# Patient Record
Sex: Male | Born: 1985 | Hispanic: Yes | Marital: Single | Smoking: Current every day smoker
Health system: Southern US, Community
[De-identification: ages and names within clinical notes are randomized; demographics above are authoritative.]

---

## 2015-06-28 ENCOUNTER — Encounter: Payer: Self-pay | Admitting: Emergency Medicine

## 2015-06-28 ENCOUNTER — Emergency Department: Payer: Self-pay

## 2015-06-28 ENCOUNTER — Emergency Department
Admission: EM | Admit: 2015-06-28 | Discharge: 2015-06-28 | Disposition: A | Discharge status: Home / Self Care | Payer: Self-pay | Attending: Emergency Medicine | Admitting: Emergency Medicine

## 2015-06-28 DIAGNOSIS — W11XXXA Fall on and from ladder, initial encounter: Secondary | ICD-10-CM | POA: Insufficient documentation

## 2015-06-28 DIAGNOSIS — Y92009 Unspecified place in unspecified non-institutional (private) residence as the place of occurrence of the external cause: Secondary | ICD-10-CM | POA: Insufficient documentation

## 2015-06-28 DIAGNOSIS — F17200 Nicotine dependence, unspecified, uncomplicated: Secondary | ICD-10-CM | POA: Insufficient documentation

## 2015-06-28 DIAGNOSIS — Y9389 Activity, other specified: Secondary | ICD-10-CM | POA: Insufficient documentation

## 2015-06-28 DIAGNOSIS — S52121A Displaced fracture of head of right radius, initial encounter for closed fracture: Secondary | ICD-10-CM | POA: Insufficient documentation

## 2015-06-28 DIAGNOSIS — Y998 Other external cause status: Secondary | ICD-10-CM | POA: Insufficient documentation

## 2015-06-28 MED ORDER — OXYCODONE-ACETAMINOPHEN 5-325 MG PO TABS: 1.0000 | ORAL_TABLET | ORAL | Status: AC | PRN

## 2015-06-28 NOTE — Discharge Instructions (Signed)
Fractura de antebrazo (Forearm Fracture) Julieta BelliniUna fractura de antebrazo es una fractura de uno de los huesos del brazo que se encuentran entre el codo y la [place]ueca. El antebrazo est formado por dos huesos:  El radio. Es el hueso que se encuentra en la parte interna del brazo cerca del pulgar.  El cbito. Es el hueso que se encuentra en la parte externa del brazo cerca del meique. En las fracturas que ocurren en la parte media del antebrazo suelen romperse el radio y el cbito. La mayora de las fracturas de antebrazo que se producen en el cbito y el radio requerirn Azerbaijanciruga. CAUSAS Las causas frecuentes de este tipo de fractura incluyen lo siguiente:  Cada sobre un brazo extendido.  Accidentes, por ejemplo, automovilstico o en bicicleta.  Un golpe fuerte y Engineering geologistdirecto en la parte media del brazo. FACTORES DE RIESGO Puede correr un riesgo mayor de tener este tipo de fractura si:  Practica deportes de contacto.  Tiene una afeccin que hace que los huesos se vuelvan delgados o frgiles (osteoporosis). SIGNOS Y SNTOMAS La fractura de antebrazo causa dolor inmediatamente despus de producirse la lesin. Otros signos y sntomas incluyen los siguientes:  Claudette LawsUna curva o un bulto anormal en el brazo (deformidad).  Hinchazn.  Adormecimiento u hormigueo.  Dolor a Insurance claims handlerla palpacin.  Imposibilidad de girar la mano de un lado al otro (rotar).  Moretones. DIAGNSTICO El mdico puede diagnosticar la fractura de antebrazo en funcin de lo siguiente:  Sus sntomas.  La historia clnica, incluida cualquier lesin reciente.  Un examen fsico. El mdico observar si hay alguna deformidad y determinar si la palpacin de la Industrial/product designerfractura provoca dolor. Adems, revisar si los huesos estn desplazados.  Una radiografa para confirmar el diagnstico y obtener ms informacin respecto del tipo de Surveyor, mineralsfractura. TRATAMIENTO Los PepsiCoobjetivos del tratamiento son Radio producermantener el o los huesos en la posicin correcta e  inmovilizarlos para que se consoliden con el transcurso del Melrosetiempo. El tratamiento depender de muchos factores, especialmente del tipo de Surveyor, mineralsfractura.  Si el o los huesos fracturados:  Estn en la posicin correcta (sin desplazamiento), tal vez solo sea necesario que le coloquen un yeso o una frula.  Estn levemente desplazados, tal vez haya que reacomodarlos manualmente (reduccin cerrada) antes de la colocacin del yeso o la frula.  Antes del yeso, es posible que deban colocarle una frula temporal. La frula deja espacio suficiente por si hay hinchazn. Despus de Time Warneralgunos das, se quita la frula y se coloca el yeso.  Es posible que deba tener el yeso durante 6 a 8semanas o como se lo haya indicado el mdico.  El yeso puede cambiarse despus de transcurridas aproximadamente 3semanas o como se lo haya indicado el mdico.  Una vez retirado el yeso, quizs tenga que hacer fisioterapia para Copyrecuperar la totalidad del movimiento de la [place]ueca o el codo.  Es posible que deba someterse a una ciruga de emergencia si tiene lo siguiente:  Uno o ambos huesos fracturados desplazados.  Una fractura con muchos fragmentos (fractura conminuta).  Una fractura que desgarra la piel (fractura abierta). Este tipo de fractura puede requerir la colocacin de Nurse, children'salambres quirrgicos, placas o tornillos para Radio producermantener el o los Airline pilothuesos en su lugar.  Es posible que, [address] dos sem[address], le hagan radiografas para Stage managercontrolar la consolidacin. INSTRUCCIONES PARA EL CUIDADO EN EL HOGAR Si tiene un yeso:  No introduzca nada adentro del yeso para rascarse la piel. Esto puede aumentar el riesgo de tener infecciones.  Liberty GlobalControle todos los das  la piel de alrededor del yeso. Informe al mdico cualquier inquietud que tenga. Puede aplicar una locin en la piel seca alrededor de los bordes del yeso. No aplique locin en la piel por debajo del yeso. Si tiene una frula:  sela como se lo haya indicado el mdico. Qutesela  solamente como se lo haya indicado el mdico.  Afloje la frula si los dedos se le entumecen, siente hormigueos o se le enfran y se tornan de Research officer, trade unioncolor azul. El bao  Cubra el yeso o la frula con una bolsa de plstico hermtica para protegerlos del agua mientras toma un bao de inmersin o [place] and Herz[place]ovinauna ducha. No permita que el yeso o la frula se mojen. Control del dolor, la rigidez y la hinchazn  Si se lo indican, aplique hielo sobre la zona lesionada:  Ponga el hielo en una bolsa plstica.  Coloque una toalla entre la piel y la bolsa de hielo.  Coloque el hielo durante 20 minutos, 2 a 3 veces por da.  Mueva los dedos con frecuencia para evitar la rigidez y reducir la hinchazn.  Cuando est sentado o acostado, mantenga la zona lesionada por encima del nivel del corazn. Conducir  No conduzca ni opere maquinaria pesada mientras toma analgsicos.  No conduzca mientras Botswanausa un yeso o una frula en la mano. Actividad  Reanude sus actividades normales como se lo haya indicado el mdico. Pregntele al mdico qu actividades son seguras para usted.  Haga ejercicios de flexibilidad solamente como se lo haya indicado el mdico. Seguridad  No apoye el peso del cuerpo sobre la extremidad lesionada hasta tanto el mdico lo autorice. Instrucciones generales  No ejerza presin en ninguna parte del yeso o de la frula hasta que se hayan endurecido. Esto puede tomar Ashlandvarias horas.  Mantenga el yeso o la frula limpios y secos.  No consuma ningn producto que contenga tabaco, lo que incluye cigarrillos, tabaco de Theatre managermascar o Administrator, Civil Servicecigarrillos electrnicos. El tabaco puede retardar la consolidacin de la fractura. Si necesita ayuda para dejar de fumar, consulte al mdico.  Tome los medicamentos solamente como se lo haya indicado el mdico.  Concurra a todas las visitas de control como se lo haya indicado el mdico. Esto es importante. SOLICITE ATENCIN MDICA SI:  El medicamento no IT trainerle calma el dolor.  El  yeso o la frula se mojan o se daan, o repentinamente siente que estn muy ajustados.  El yeso se afloja.  Tiene dolor ms intenso o ms hinchazn que antes de Landque le colocaran el yeso.  Tiene dolor intenso cuando Hewlett-Packardestira los dedos.  Sigue teniendo Engineer, miningdolor o rigidez en el codo o la mueca despus de que le saquen el yeso. SOLICITE ATENCIN MDICA DE INMEDIATO SI:  No puede mover los dedos.  Pierde la sensibilidad en los dedos o la [place]mano.  La mano o los dedos estn fros y se le ponen plidos o de color [place]azul.  Percibe que sale mal olor del yeso.  Observa una secrecin que proviene desde el interior del yeso.  Tiene manchas de sangre o secrecin nuevas que se filtran por el yeso.   Esta informacin no tiene Theme park managercomo fin reemplazar el consejo del mdico. Asegrese de hacerle al mdico cualquier pregunta que tenga.   Document Released: 03/24/2005 Document Revised: 04/14/2014 Elsevier Interactive Patient Education [employer]2016 Elsevier Inc.

## 2015-06-28 NOTE — ED Provider Notes (Signed)
[facility] Emergency Department Provider Note  ____________________________________________  Time seen: Approximately 11:07 AM  I have reviewed the triage vital signs and the nursing notes.   HISTORY  Chief Complaint Fall and Wrist Pain    HPI Ryan Keller is a 30 y.o. male presents for evaluation of right wrist pain. Hand right hand swelling. Patient states he fell off a ladder yesterday complains of pain to his right wrist. Rates his pain is about 6/10. Unable to fully rotate his hand and wrist. History reviewed. No pertinent past medical history.  There are no active problems to display for this patient.   History reviewed. No pertinent past surgical history.  Current Outpatient Rx  Name  Route  Sig  Dispense  Refill  . oxyCODONE-acetaminophen (ROXICET) 5-325 MG tablet   Oral   Take 1-2 tablets by mouth every 4 (four) hours as needed for severe pain.   15 tablet   0     Allergies Review of patient's allergies indicates no known allergies.  History reviewed. No pertinent family history.  Social History Social History  Substance Use Topics  . Smoking status: Current Every Day Smoker  . Smokeless tobacco: None  . Alcohol Use: Yes    Review of Systems Constitutional: No fever/chills Eyes: No visual changes. ENT: No sore throat. Cardiovascular: Denies chest pain. Respiratory: Denies shortness of breath. Musculoskeletal: Positive for right wrist pain Skin: Negative for rash. Neurological: Negative for headaches, focal weakness or numbness.  10-point ROS otherwise negative.  ____________________________________________   PHYSICAL EXAM:  VITAL SIGNS: ED Triage Vitals  Enc Vitals Group     BP 06/28/15 1009 143/93 mmHg     Pulse Rate 06/28/15 1009 66     Resp 06/28/15 1009 20     Temp 06/28/15 1009 98 F (36.7 C)     Temp Source 06/28/15 1009 Oral     SpO2 06/28/15 1009 100 %     Weight 06/28/15 1009 180 lb  (81.647 kg)     Height 06/28/15 1009 5\' 6"  (1.676 m)     Head Cir --      Peak Flow --      Pain Score 06/28/15 1009 6     Pain Loc --      Pain Edu? --      Excl. in GC? --     Constitutional: Alert and oriented. Well appearing and in no acute distress. Musculoskeletal: Right wrist extremely edematous right hand extremely edematous distally neurovascularly intact limited range of motion unable to pronate and supinate right hand. Neurologic:  Normal speech and language. No gross focal neurologic deficits are appreciated. No gait instability. Skin:  Skin is warm, dry and intact. No rash noted. Psychiatric: Mood and affect are normal. Speech and behavior are normal.  ____________________________________________   LABS (all labs ordered are listed, but only abnormal results are displayed)  Labs Reviewed - No data to display ____________________________________________  EKG   ____________________________________________  RADIOLOGY  Positive for a minimally displaced right distal radial fracture. ____________________________________________   PROCEDURES  Procedure(s) performed: None  Critical Care performed: No  ____________________________________________   INITIAL IMPRESSION / ASSESSMENT AND PLAN / ED COURSE  Pertinent labs & imaging results that were available during my care of the patient were reviewed by me and considered in my medical decision making (see chart for details).  Radial fracture. Rx given for Percocet 5/325 patient placed in an OCL splint and referred to Dr. Rosita KeaMenz, orthopedic doctor on  call ____________________________________________   FINAL CLINICAL IMPRESSION(S) / ED DIAGNOSES  Final diagnoses:  Radial head fracture, closed, right, initial encounter     This chart was dictated using voice recognition software/Dragon. Despite best efforts to proofread, errors can occur which can change the meaning. Any change was purely  unintentional.   Evangeline Dakin, PA-C 06/28/15 1118  Emily Filbert, MD 06/28/15 1120

## 2015-06-28 NOTE — ED Notes (Signed)
States he fell yesterday  Pain and swelling noted to right wrist. Positive pulses noted

## 2015-06-28 NOTE — ED Notes (Signed)
Pt to ed with c/o fall off ladder yesterday while at home, pt states pain in right hand and right wrist.  Swelling noted at triage. +movement, pulse and sensation in right wrist.

## 2017-04-29 ENCOUNTER — Emergency Department: Payer: Worker's Compensation

## 2017-04-29 ENCOUNTER — Emergency Department
Admission: EM | Admit: 2017-04-29 | Discharge: 2017-04-29 | Disposition: A | Discharge status: Home / Self Care | Payer: Worker's Compensation | Attending: Emergency Medicine | Admitting: Emergency Medicine

## 2017-04-29 ENCOUNTER — Encounter: Payer: Self-pay | Admitting: Intensive Care

## 2017-04-29 DIAGNOSIS — Y9289 Other specified places as the place of occurrence of the external cause: Secondary | ICD-10-CM | POA: Insufficient documentation

## 2017-04-29 DIAGNOSIS — S52514A Nondisplaced fracture of right radial styloid process, initial encounter for closed fracture: Secondary | ICD-10-CM | POA: Insufficient documentation

## 2017-04-29 DIAGNOSIS — F17210 Nicotine dependence, cigarettes, uncomplicated: Secondary | ICD-10-CM | POA: Insufficient documentation

## 2017-04-29 DIAGNOSIS — S01112A Laceration without foreign body of left eyelid and periocular area, initial encounter: Secondary | ICD-10-CM | POA: Insufficient documentation

## 2017-04-29 DIAGNOSIS — Z23 Encounter for immunization: Secondary | ICD-10-CM | POA: Insufficient documentation

## 2017-04-29 DIAGNOSIS — S0292XA Unspecified fracture of facial bones, initial encounter for closed fracture: Secondary | ICD-10-CM | POA: Insufficient documentation

## 2017-04-29 DIAGNOSIS — Y939 Activity, unspecified: Secondary | ICD-10-CM | POA: Insufficient documentation

## 2017-04-29 DIAGNOSIS — S0990XA Unspecified injury of head, initial encounter: Secondary | ICD-10-CM

## 2017-04-29 DIAGNOSIS — S022XXA Fracture of nasal bones, initial encounter for closed fracture: Secondary | ICD-10-CM

## 2017-04-29 DIAGNOSIS — S52502A Unspecified fracture of the lower end of left radius, initial encounter for closed fracture: Secondary | ICD-10-CM

## 2017-04-29 DIAGNOSIS — W132XXA Fall from, out of or through roof, initial encounter: Secondary | ICD-10-CM | POA: Insufficient documentation

## 2017-04-29 DIAGNOSIS — Y990 Civilian activity done for income or pay: Secondary | ICD-10-CM | POA: Insufficient documentation

## 2017-04-29 LAB — COMPREHENSIVE METABOLIC PANEL
ALBUMIN: 4.4 g/dL (ref 3.5–5.0)
ALK PHOS: 68 U/L (ref 38–126)
ALT: 25 U/L (ref 17–63)
AST: 42 U/L — AB (ref 15–41)
Anion gap: 8 (ref 5–15)
BUN: 15 mg/dL (ref 6–20)
CALCIUM: 9.1 mg/dL (ref 8.9–10.3)
CHLORIDE: 105 mmol/L (ref 101–111)
CO2: 22 mmol/L (ref 22–32)
CREATININE: 1.27 mg/dL — AB (ref 0.61–1.24)
GFR calc Af Amer: >60 mL/min (ref >60)
GFR calc non Af Amer: >60 mL/min (ref >60)
GLUCOSE: 140 mg/dL — AB (ref 65–99)
Potassium: 3.2 mmol/L — ABNORMAL LOW (ref 3.5–5.1)
SODIUM: 135 mmol/L (ref 135–145)
Total Bilirubin: 0.7 mg/dL (ref 0.3–1.2)
Total Protein: 8.1 g/dL (ref 6.5–8.1)

## 2017-04-29 LAB — CBC WITH DIFFERENTIAL/PLATELET
BASOS ABS: 0 10*3/uL (ref 0–0.1)
BASOS PCT: 0 %
EOS ABS: 0.3 10*3/uL (ref 0–0.7)
Eosinophils Relative: 3 %
HCT: 42.8 % (ref 40.0–52.0)
HEMOGLOBIN: 14.4 g/dL (ref 13.0–18.0)
LYMPHS ABS: 3.7 10*3/uL — AB (ref 1.0–3.6)
Lymphocytes Relative: 33 %
MCH: 29.5 pg (ref 26.0–34.0)
MCHC: 33.7 g/dL (ref 32.0–36.0)
MCV: 87.5 fL (ref 80.0–100.0)
Monocytes Absolute: 0.5 10*3/uL (ref 0.2–1.0)
Monocytes Relative: 5 %
NEUTROS PCT: 59 %
Neutro Abs: 6.7 10*3/uL — ABNORMAL HIGH (ref 1.4–6.5)
Platelets: 338 10*3/uL (ref 150–440)
RBC: 4.89 MIL/uL (ref 4.40–5.90)
RDW: 12.6 % (ref 11.5–14.5)
WBC: 11.2 10*3/uL — AB (ref 3.8–10.6)

## 2017-04-29 LAB — ETHANOL: Alcohol, Ethyl (B): <10 mg/dL (ref <10)

## 2017-04-29 MED ORDER — LIDOCAINE-EPINEPHRINE (PF) 2 %-1:200000 IJ SOLN: 10.0000 mL | Freq: Once | INTRAMUSCULAR | Status: DC

## 2017-04-29 MED ORDER — LORAZEPAM 2 MG/ML IJ SOLN
1.0000 mg | Freq: Once | INTRAMUSCULAR | Status: AC
  Administered 2017-04-29: 1 mg via INTRAVENOUS
  Filled 2017-04-29: qty 1

## 2017-04-29 MED ORDER — HYDROMORPHONE HCL 1 MG/ML IJ SOLN
1.0000 mg | Freq: Once | INTRAMUSCULAR | Status: AC
  Administered 2017-04-29: 1 mg via INTRAVENOUS

## 2017-04-29 MED ORDER — TETANUS-DIPHTH-ACELL PERTUSSIS 5-2.5-18.5 LF-MCG/0.5 IM SUSP
0.5000 mL | Freq: Once | INTRAMUSCULAR | Status: AC
  Administered 2017-04-29: 0.5 mL via INTRAMUSCULAR
  Filled 2017-04-29: qty 0.5

## 2017-04-29 MED ORDER — OXYCODONE-ACETAMINOPHEN 7.5-325 MG PO TABS: 1.0000 | ORAL_TABLET | ORAL | 0 refills | Status: AC | PRN

## 2017-04-29 MED ORDER — MUPIROCIN 2 % EX OINT: TOPICAL_OINTMENT | CUTANEOUS | 0 refills | Status: AC

## 2017-04-29 MED ORDER — LIDOCAINE-EPINEPHRINE (PF) 1 %-1:200000 IJ SOLN
INTRAMUSCULAR | Status: AC
  Administered 2017-04-29: 10 mL via INTRADERMAL
  Filled 2017-04-29: qty 30

## 2017-04-29 MED ORDER — LIDOCAINE-EPINEPHRINE (PF) 1 %-1:200000 IJ SOLN
10.0000 mL | Freq: Once | INTRAMUSCULAR | Status: AC
  Administered 2017-04-29: 10 mL via INTRADERMAL

## 2017-04-29 MED ORDER — HYDROMORPHONE HCL 1 MG/ML IJ SOLN
INTRAMUSCULAR | Status: AC
  Administered 2017-04-29: 1 mg via INTRAVENOUS
  Filled 2017-04-29: qty 1

## 2017-04-29 NOTE — ED Notes (Signed)
Attempted to contact employer, Ryan Keller with [employer]. No answer, left message. No answer. Pt does want to file workers comp at this time.

## 2017-04-29 NOTE — ED Notes (Signed)
Pt alert and oriented X4, active, cooperative, pt in NAD. RR even and unlabored, color WNL.  Pt informed to return if any life threatening symptoms occur.  Discharge and followup instructions reviewed. Pt left with family.    

## 2017-04-29 NOTE — ED Notes (Signed)
Pt ambulates with ease. Pt states employer to arrive shortly.

## 2017-04-29 NOTE — ED Notes (Signed)
Patient transported to CT 

## 2017-04-29 NOTE — ED Notes (Signed)
Pt denies LOC. Laceration to face covered at this time.

## 2017-04-29 NOTE — ED Notes (Addendum)
Ryan Keller, owner of [employer] at patient bedside. States that patient is subcontracted through [employer], [phone]. States that he spoke with Efrain and states he and patient are in agreement that patient will be self pay. Attempted to call [name] at [phone] and no answer or able to leave message. Also attempted to call at [phone], no answer-unable to leave message. Dawn, registration notified. At bedside to discuss with Ryan Keller

## 2017-04-29 NOTE — ED Notes (Signed)
ED Provider at bedside. 

## 2017-04-29 NOTE — ED Notes (Signed)
c collar in place at this time.

## 2017-04-29 NOTE — ED Triage Notes (Addendum)
Patient was at work and fell off a roof onto gravel. Reports hitting head first. Remembers what happened but states was confused at first. C/o neck and hand pain. Laceration all across the R side of face. A&O at this time

## 2017-04-29 NOTE — ED Provider Notes (Addendum)
[facility] Emergency Department Provider Note       Time seen: ----------------------------------------- 10:33 AM on 04/29/2017 -----------------------------------------   I have reviewed the triage vital signs and the nursing notes.  HISTORY   Chief Complaint Head Injury    HPI Ryan Keller is a 32 y.o. male with no significant past medical history who presents to the ED for a fall.  Patient was at work and fell off of a roof onto gravel.  Patient reports hitting face first.  He remembers what happened, again did not lose consciousness but was confused at first.  He is complaining of left-sided neck pain and bilateral hand pain.  Reportedly he has sustained significant lacerations to the right side of his face.  He denies chest pain, difficulty breathing, back pain or leg pain.  History reviewed. No pertinent past medical history.  There are no active problems to display for this patient.   History reviewed. No pertinent surgical history.  Allergies Patient has no known allergies.  Social History Social History   Tobacco Use  . Smoking status: Current Every Day Smoker  Substance Use Topics  . Alcohol use: Yes  . Drug use: No    Review of Systems Constitutional: Negative for fever. Eyes: Negative for vision changes.  Patient reports being able to see out of the right eye Cardiovascular: Negative for chest pain. Respiratory: Negative for shortness of breath. Gastrointestinal: Negative for abdominal pain, vomiting and diarrhea. Musculoskeletal positive for neck pain Skin: Positive for lacerations Neurological: Positive for headache, facial pain  All systems negative/normal/unremarkable except as stated in the HPI  ____________________________________________   PHYSICAL EXAM:  VITAL SIGNS: ED Triage Vitals  Enc Vitals Group     BP 04/29/17 1029 (!) 141/96     Pulse Rate 04/29/17 1029 97     Resp 04/29/17 1029 18      Temp --      Temp src --      SpO2 04/29/17 1029 100 %     Weight 04/29/17 1028 190 lb (86.2 kg)     Height 04/29/17 1028 6' (1.829 m)     Head Circumference --      Peak Flow --      Pain Score 04/29/17 1028 10     Pain Loc --      Pain Edu? --      Excl. in GC? --     Constitutional: Alert and oriented.  Vital to moderate distress from pain Eyes: Conjunctivae are normal. Normal extraocular movements.  Right periorbital swelling and ecchymosis.  Normal extraocular movement ENT   Head: Normocephalic, extensive facial contusions and lacerations, particularly in the right periorbital area and through the right eyebrow   Nose: Diffuse nasal swelling with nasal laceration      Ears: Right TM appears to be ruptured, difficult to tell if it is chronic   Mouth/Throat: Mucous membranes are moist.   Neck: No stridor.  Mandible and larynx are nontender. Cardiovascular: Normal rate, regular rhythm. No murmurs, rubs, or gallops. Respiratory: Normal respiratory effort without tachypnea nor retractions. Breath sounds are clear and equal bilaterally. No wheezes/rales/rhonchi. Gastrointestinal: Soft and nontender. Normal bowel sounds Musculoskeletal: Nontender with normal range of motion in extremities. No lower extremity tenderness nor edema.  No specific C-spine tenderness, left paraspinous muscle tenderness in the cervical spine. Neurologic:  Normal speech and language. No gross focal neurologic deficits are appreciated.  Skin: Extensive facial lacerations and contusions.  Total wound lengths  around 8 cm Psychiatric: Mood and affect are normal. Speech and behavior are normal.  ____________________________________________  ED COURSE:  As part of my medical decision making, I reviewed the following data within the electronic MEDICAL RECORD NUMBER History obtained from family if available, nursing notes, old chart and ekg, as well as notes from prior ED visits. Patient presented for fall off  of a roof, we will assess with labs and imaging as indicated at this time.   Marland Kitchen..Laceration Repair Date/Time: 04/29/2017 12:27 PM Performed by: Emily FilbertWilliams, Chong Wojdyla E, MD Authorized by: Emily FilbertWilliams, Zia Kanner E, MD   Consent:    Consent obtained:  Emergent situation   Consent given by:  Patient Anesthesia (see MAR for exact dosages):    Anesthesia method:  Local infiltration   Local anesthetic:  Lidocaine 1% WITH epi Laceration details:    Location:  Face   Face location:  R eyebrow   Length (cm):  8   Depth (mm):  10 Repair type:    Repair type:  Intermediate Exploration:    Wound exploration: wound explored through full range of motion     Wound extent: muscle damage     Contaminated: no   Treatment:    Area cleansed with:  Betadine   Amount of cleaning:  Standard   Irrigation solution:  Sterile saline   Irrigation method:  Pressure wash   Visualized foreign bodies/material removed: no   Skin repair:    Repair method:  Sutures   Suture size:  5-0   Suture material:  Prolene   Number of sutures:  23 Approximation:    Approximation:  Close   Vermilion border: well-aligned   Post-procedure details:    Dressing:  Open (no dressing)   Patient tolerance of procedure:  Tolerated well, no immediate complications   ____________________________________________   EKG: Interpreted by me, sinus rhythm rate 89 bpm, normal PR interval, normal QRS, normal QT.  LABS (pertinent positives/negatives)  Labs Reviewed  CBC WITH DIFFERENTIAL/PLATELET - Abnormal; Notable for the following components:      Result Value   WBC 11.2 (*)    Neutro Abs 6.7 (*)    Lymphs Abs 3.7 (*)    All other components within normal limits  COMPREHENSIVE METABOLIC PANEL - Abnormal; Notable for the following components:   Potassium 3.2 (*)    Glucose, Bld 140 (*)    Creatinine, Ser 1.27 (*)    AST 42 (*)    All other components within normal limits  ETHANOL  URINE DRUG SCREEN, QUALITATIVE ([facility] ONLY)     RADIOLOGY Images were viewed by me  CT head, C-spine, maxillofacial, bilateral wrist x-rays IMPRESSION: No acute intracranial abnormality.  Fractures through the nasal bones and floor of the right orbit. Herniation of orbital fat through the orbital floor fracture. No evidence of muscular entrapment.  No acute bony abnormality in the cervical spine. IMPRESSION: 1. No osseous abnormality. 2. Debris on the superficial palmar hand and little digit measuring up to 1 mm. IMPRESSION: Distal right radial intra-articular fracture and ulnar styloid fracture. ____________________________________________  DIFFERENTIAL DIAGNOSIS   Fall, contusion, laceration, fracture, subdural hematoma, wrist fracture, skull fracture, facial fractures  FINAL ASSESSMENT AND PLAN  Fall, head injury, facial lacerations, nasal fractures, orbital floor fracture, facial lacerations, distal radius and ulna fractures   Plan: Patient had presented for a fall off of the roof. Patient's labs were unremarkable. Patient's imaging revealed nasal fractures as well as a right orbital floor fracture without signs of entrapment.  He did have a distal right radius and ulna fracture as well and was placed in a splint.  Lacerations were repaired as dictated above.  Overall he does not have signs of a concussion.  He appears to be neurologically intact and we will have him follow-up tomorrow for recheck.   Emily Filbert, MD   Note: This note was generated in part or whole with voice recognition software. Voice recognition is usually quite accurate but there are transcription errors that can and very often do occur. I apologize for any typographical errors that were not detected and corrected.     Emily Filbert, MD 04/29/17 1229    Emily Filbert, MD 04/29/17 1237    Emily Filbert, MD 04/29/17 1316

## 2018-07-08 IMAGING — CR DG HAND COMPLETE 3+V*R*
3 series · 3 of 3 positions shown · non-contrast
Comparison: 06/28/2015

CLINICAL DATA: Fall with wrist deformity and hand pain. Initial
encounter.

EXAM:
RIGHT HAND - COMPLETE 3+ VIEW

[hand ap]
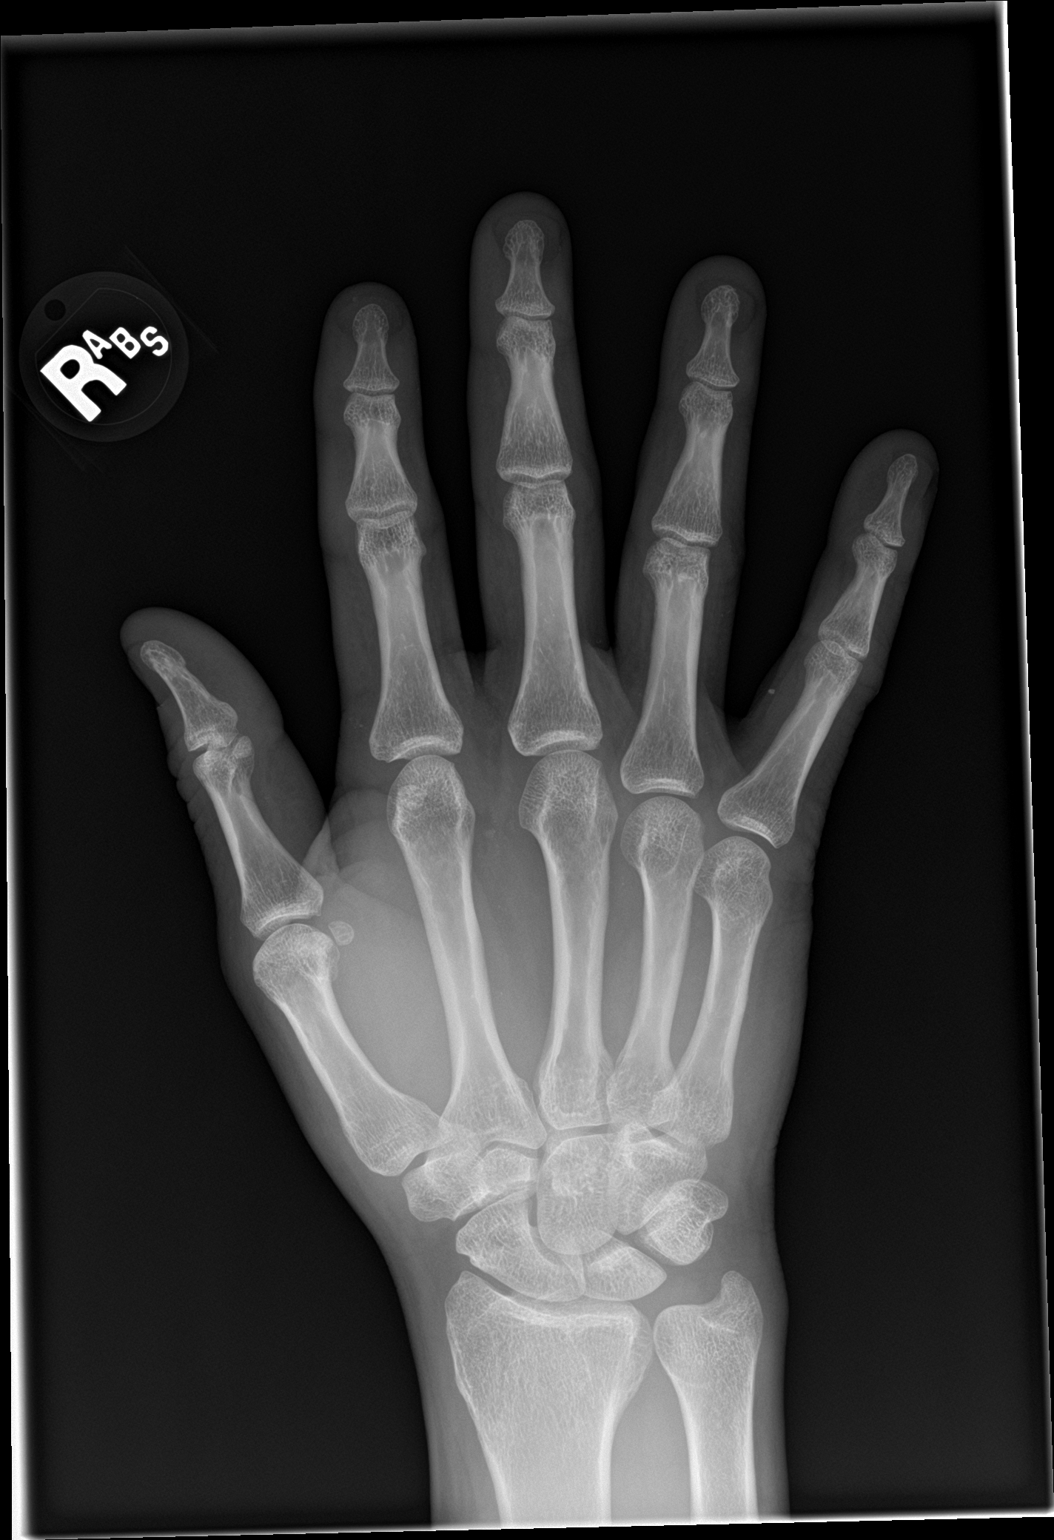

[hand obl]
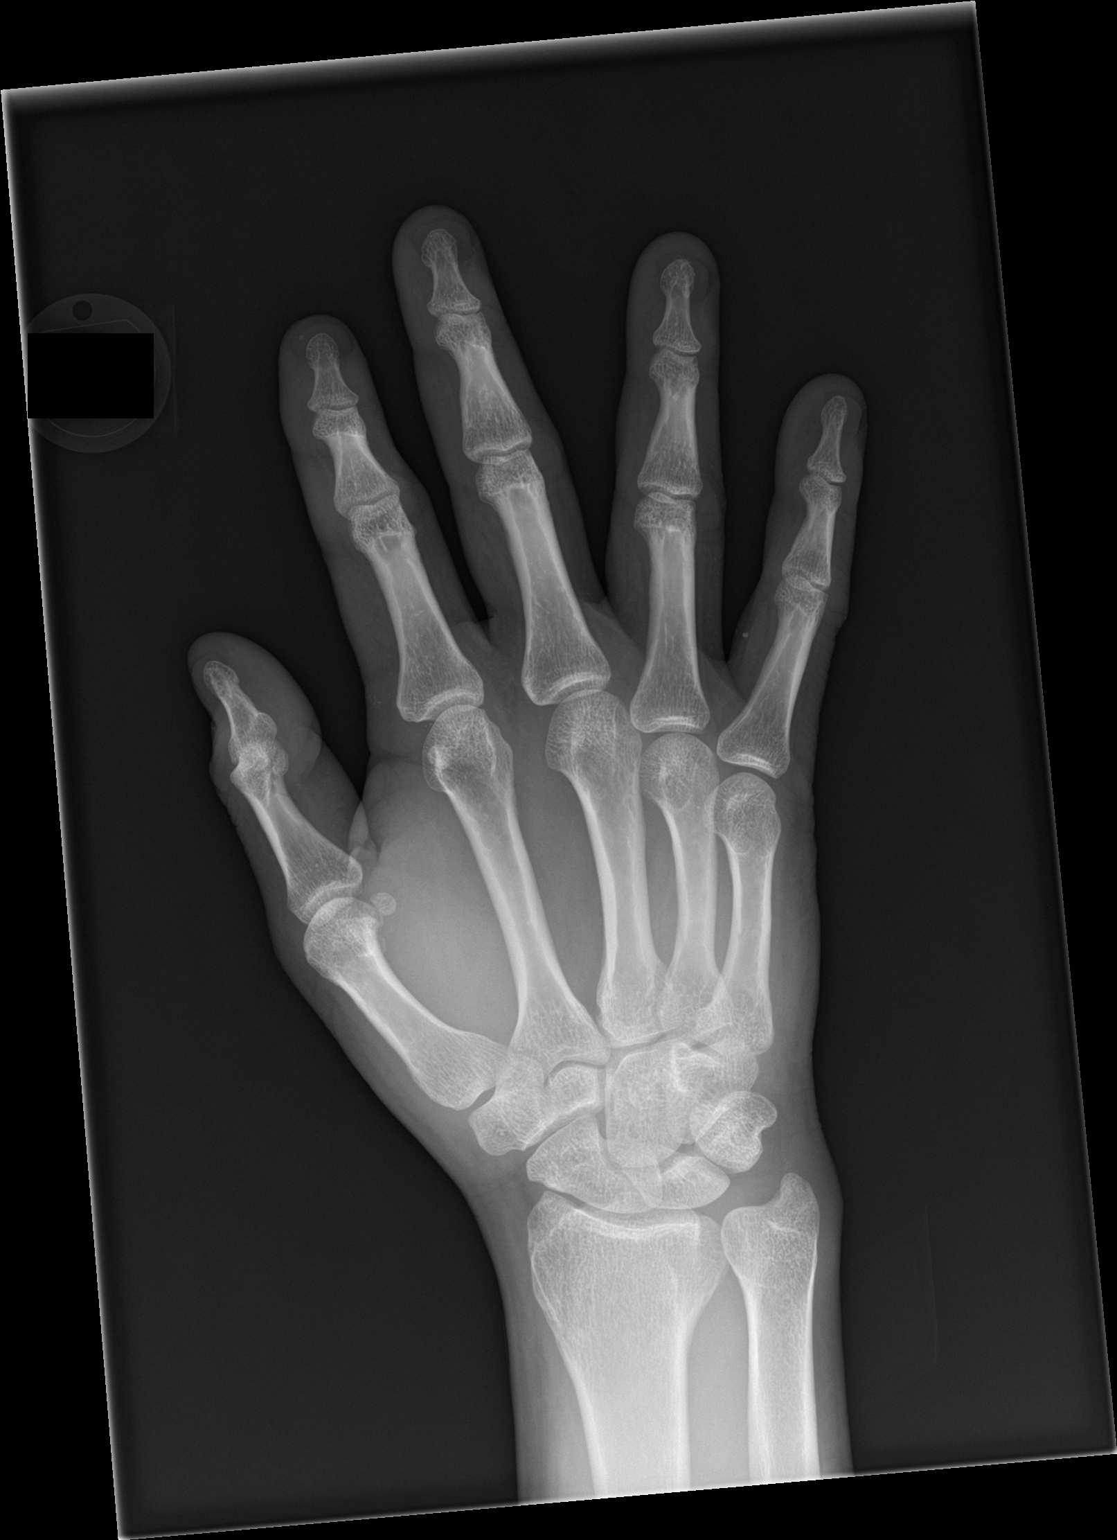

[hand lat]
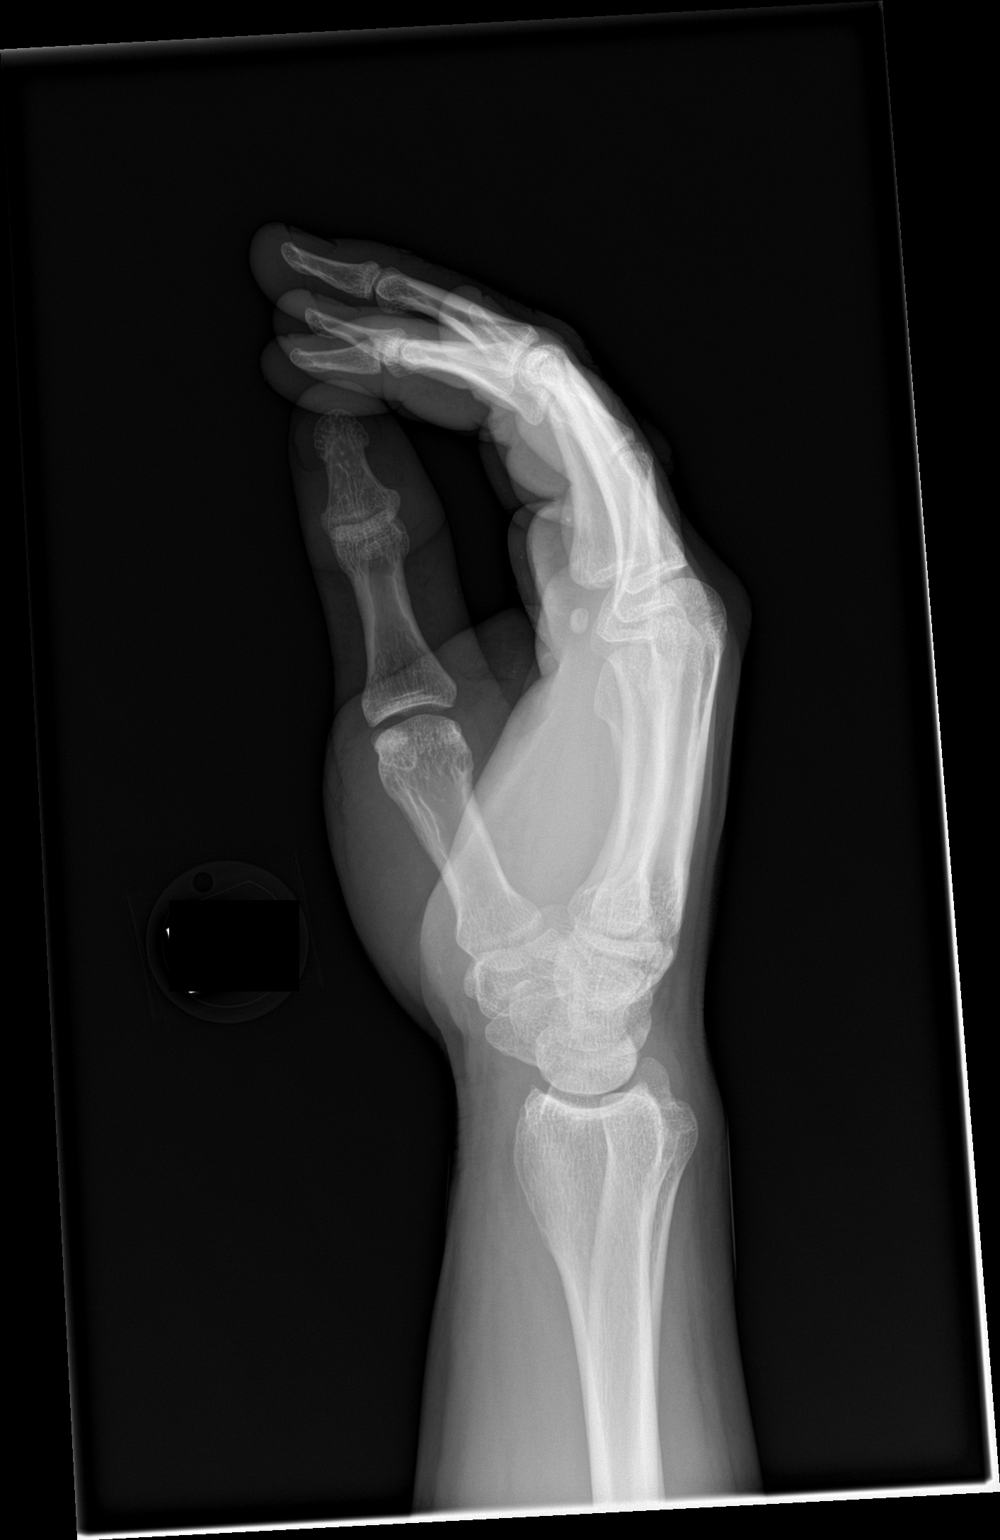

[3 of 3 positions shown; findings below may reference images not displayed]

FINDINGS: Punctate high-density material on the palmar hand and proximal
little digit where it measures 1 mm. No fracture or dislocation.
IMPRESSION: 1. No osseous abnormality.
2. Debris on the superficial palmar hand and little digit measuring
up to 1 mm.

## 2018-09-16 ENCOUNTER — Telehealth: Payer: Self-pay | Admitting: *Deleted

## 2018-09-16 ENCOUNTER — Other Ambulatory Visit: Payer: Self-pay

## 2018-09-16 DIAGNOSIS — Z20822 Contact with and (suspected) exposure to covid-19: Secondary | ICD-10-CM

## 2018-09-16 NOTE — Telephone Encounter (Signed)
Received call from [facility] to schedule a pt for covid-19 testing.  Pt was notified using Ryan Keller ID [phone] to schedule the appointment for covid-19. He is scheduled for today at the [facility]. He voiced understanding.

## 2018-09-20 ENCOUNTER — Telehealth: Payer: Self-pay | Admitting: General Practice

## 2018-09-20 NOTE — Telephone Encounter (Signed)
Lab result note for covid19 results typed negative in my note in error. It was reviewed as positive results with patient.

## 2018-09-21 LAB — NOVEL CORONAVIRUS, NAA: SARS-CoV-2, NAA: DETECTED — AB
